# Patient Record
Sex: Female | Born: 1985 | Race: White | Hispanic: Yes | Marital: Single | Smoking: Never smoker
Health system: Southern US, Community
[De-identification: ages and names within clinical notes are randomized; demographics above are authoritative.]

## PROBLEM LIST (undated history)

## (undated) HISTORY — PX: CHOLECYSTECTOMY: SHX55

---

## 2014-09-27 ENCOUNTER — Emergency Department: Payer: Self-pay | Admitting: Internal Medicine

## 2014-09-27 LAB — URINALYSIS, COMPLETE
Bilirubin,UR: NEGATIVE
GLUCOSE, UR: NEGATIVE mg/dL (ref 0–75)
KETONE: NEGATIVE
NITRITE: NEGATIVE
Ph: 7 (ref 4.5–8.0)
RBC,UR: 13 /HPF (ref 0–5)
Specific Gravity: 1.023 (ref 1.003–1.030)
Squamous Epithelial: 21
WBC UR: 67 /HPF (ref 0–5)

## 2014-09-27 LAB — CBC
HCT: 39 % (ref 35.0–47.0)
HGB: 12.7 g/dL (ref 12.0–16.0)
MCH: 27.2 pg (ref 26.0–34.0)
MCHC: 32.7 g/dL (ref 32.0–36.0)
MCV: 83 fL (ref 80–100)
PLATELETS: 290 10*3/uL (ref 150–440)
RBC: 4.68 10*6/uL (ref 3.80–5.20)
RDW: 14.9 % — ABNORMAL HIGH (ref 11.5–14.5)
WBC: 4.6 10*3/uL (ref 3.6–11.0)

## 2014-09-27 LAB — D-DIMER(ARMC): D-DIMER: 235 ng/mL

## 2014-09-27 LAB — BASIC METABOLIC PANEL
Anion Gap: 4 — ABNORMAL LOW (ref 7–16)
BUN: 11 mg/dL (ref 7–18)
CREATININE: 0.72 mg/dL (ref 0.60–1.30)
Calcium, Total: 8.9 mg/dL (ref 8.5–10.1)
Chloride: 107 mmol/L (ref 98–107)
Co2: 27 mmol/L (ref 21–32)
EGFR (Non-African Amer.): >60
Glucose: 123 mg/dL — ABNORMAL HIGH (ref 65–99)
OSMOLALITY: 276 (ref 275–301)
Potassium: 3.7 mmol/L (ref 3.5–5.1)
SODIUM: 138 mmol/L (ref 136–145)

## 2014-09-27 LAB — PRO B NATRIURETIC PEPTIDE: B-TYPE NATIURETIC PEPTID: 12 pg/mL (ref 0–125)

## 2014-09-27 LAB — TROPONIN I: Troponin-I: <0.02 ng/mL

## 2014-10-09 ENCOUNTER — Emergency Department: Payer: Self-pay | Admitting: Emergency Medicine

## 2014-10-09 LAB — BASIC METABOLIC PANEL
Anion Gap: 7 (ref 7–16)
BUN: 15 mg/dL (ref 7–18)
CHLORIDE: 106 mmol/L (ref 98–107)
CREATININE: 0.58 mg/dL — AB (ref 0.60–1.30)
Calcium, Total: 8.8 mg/dL (ref 8.5–10.1)
Co2: 27 mmol/L (ref 21–32)
EGFR (African American): >60
EGFR (Non-African Amer.): >60
Glucose: 107 mg/dL — ABNORMAL HIGH (ref 65–99)
Osmolality: 281 (ref 275–301)
Potassium: 3.6 mmol/L (ref 3.5–5.1)
SODIUM: 140 mmol/L (ref 136–145)

## 2014-10-09 LAB — CBC
HCT: 39.5 % (ref 35.0–47.0)
HGB: 12.7 g/dL (ref 12.0–16.0)
MCH: 27 pg (ref 26.0–34.0)
MCHC: 32.1 g/dL (ref 32.0–36.0)
MCV: 84 fL (ref 80–100)
PLATELETS: 287 10*3/uL (ref 150–440)
RBC: 4.69 10*6/uL (ref 3.80–5.20)
RDW: 14.6 % — ABNORMAL HIGH (ref 11.5–14.5)
WBC: 5.4 10*3/uL (ref 3.6–11.0)

## 2014-10-09 LAB — TROPONIN I

## 2014-10-09 LAB — D-DIMER(ARMC): D-DIMER: 177 ng/mL

## 2014-10-19 ENCOUNTER — Emergency Department: Payer: Self-pay | Admitting: Internal Medicine

## 2014-10-19 LAB — CBC
HCT: 43.6 % (ref 35.0–47.0)
HGB: 13.9 g/dL (ref 12.0–16.0)
MCH: 26.7 pg (ref 26.0–34.0)
MCHC: 32 g/dL (ref 32.0–36.0)
MCV: 84 fL (ref 80–100)
Platelet: 356 10*3/uL (ref 150–440)
RBC: 5.22 10*6/uL — ABNORMAL HIGH (ref 3.80–5.20)
RDW: 14.5 % (ref 11.5–14.5)
WBC: 13.4 10*3/uL — ABNORMAL HIGH (ref 3.6–11.0)

## 2014-10-19 LAB — DRUG SCREEN, URINE
Amphetamines, Ur Screen: NEGATIVE (ref ?–1000)
BARBITURATES, UR SCREEN: NEGATIVE (ref ?–200)
BENZODIAZEPINE, UR SCRN: NEGATIVE (ref ?–200)
CANNABINOID 50 NG, UR ~~LOC~~: NEGATIVE (ref ?–50)
Cocaine Metabolite,Ur ~~LOC~~: NEGATIVE (ref ?–300)
MDMA (ECSTASY) UR SCREEN: NEGATIVE (ref ?–500)
Methadone, Ur Screen: NEGATIVE (ref ?–300)
Opiate, Ur Screen: NEGATIVE (ref ?–300)
PHENCYCLIDINE (PCP) UR S: NEGATIVE (ref ?–25)
Tricyclic, Ur Screen: NEGATIVE (ref ?–1000)

## 2014-10-19 LAB — COMPREHENSIVE METABOLIC PANEL
ALT: 21 U/L
Albumin: 4.4 g/dL (ref 3.4–5.0)
Alkaline Phosphatase: 90 U/L
Anion Gap: 5 — ABNORMAL LOW (ref 7–16)
BILIRUBIN TOTAL: 0.5 mg/dL (ref 0.2–1.0)
BUN: 15 mg/dL (ref 7–18)
CO2: 30 mmol/L (ref 21–32)
Calcium, Total: 9.5 mg/dL (ref 8.5–10.1)
Chloride: 105 mmol/L (ref 98–107)
Creatinine: 0.77 mg/dL (ref 0.60–1.30)
EGFR (Non-African Amer.): >60
Glucose: 121 mg/dL — ABNORMAL HIGH (ref 65–99)
Osmolality: 281 (ref 275–301)
Potassium: 3.5 mmol/L (ref 3.5–5.1)
SGOT(AST): 26 U/L (ref 15–37)
SODIUM: 140 mmol/L (ref 136–145)
Total Protein: 9.1 g/dL — ABNORMAL HIGH (ref 6.4–8.2)

## 2014-10-19 LAB — URINALYSIS, COMPLETE
BACTERIA: NONE SEEN
BLOOD: NEGATIVE
Bilirubin,UR: NEGATIVE
GLUCOSE, UR: NEGATIVE mg/dL (ref 0–75)
NITRITE: NEGATIVE
Ph: 6 (ref 4.5–8.0)
Protein: 100
RBC,UR: 4 /HPF (ref 0–5)
SPECIFIC GRAVITY: 1.026 (ref 1.003–1.030)

## 2014-10-19 LAB — SALICYLATE LEVEL: Salicylates, Serum: <1.7 mg/dL

## 2014-10-19 LAB — ETHANOL: ETHANOL LVL: 3 mg/dL

## 2014-10-19 LAB — ACETAMINOPHEN LEVEL: Acetaminophen: <2 ug/mL

## 2014-11-19 ENCOUNTER — Emergency Department: Payer: Self-pay | Admitting: Emergency Medicine

## 2014-12-09 ENCOUNTER — Emergency Department: Payer: Self-pay | Admitting: Emergency Medicine
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
## 2015-01-28 NOTE — Consult Note (Signed)
PATIENT NAME:  Karen Bridges, Karen Bridges MR#:  [id number] DATE OF BIRTH:  1986/03/23  DATE OF CONSULTATION:  10/20/2014  REFERRING PHYSICIAN:   CONSULTING PHYSICIAN:  Montine Hight K. Brynnan Rodenbaugh, MD  SUBJECTIVE: The patient was seen in consultation in [facility] emergency room in [facility]. The patient is a 29 year old [place] female who is employed as a Conservation officer, naturecashier at [employer] and is not married and has been living with ex-fiancee's family. The patient and staff report that she found her fiancee is cheating and she got upset and anxious and took an overdose of few pills of Prozac which she got while she was in the [employer]. The patient had Prozac 20 mg capsules which she took 5 of them because she was upset and angry. Brother-in-law called for help and EMS brought her here.   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 No history of suicide attempt.  ALCOHOL AND DRUGS: Denies drinking alcohol or having problems with alcohol.   MENTAL STATUS: The patient is alert and oriented, calm, pleasant and cooperative. No agitation. Affect is appropriate with her mood. Denies feeling depressed, but she is upset and angry about her fiancee cheating. Denies auditory or visual hallucinations. Denies hearing voices or seeing things. Denies paranoid or suspicious ideas. Denies thought insertion or thought control. Denies having grandiose ideas. Memory is intact. Cognition intact. General knowledge and information fair. Insight and judgment fair. Impulse control is fair and improved.  IMPRESSION: Adjustment disorder with anxiety and depression.  RECOMMENDATIONS: Discontinue involuntary commitment and discharge the patient home. The patient will take help from the [facility] as she is discharged from the [employer]. She has been [facility] for support and coping skills to deal with the stresses of life.  ____________________________ Jannet MantisSurya K.  Guss Bundehalla, MD skc:sb D: 10/20/2014 16:56:26 ET T: 10/20/2014 17:22:13 ET JOB#: [id number]  cc: Monika SalkSurya K. Guss Bundehalla, MD, <Dictator> Beau FannySURYA K Quisha Mabie MD ELECTRONICALLY SIGNED 10/21/2014 16:30

## 2015-10-05 ENCOUNTER — Emergency Department
Admission: EM | Admit: 2015-10-05 | Discharge: 2015-10-05 | Disposition: A | Discharge status: Home / Self Care | Payer: Non-veteran care | Attending: Emergency Medicine | Admitting: Emergency Medicine

## 2015-10-05 ENCOUNTER — Emergency Department: Payer: Non-veteran care

## 2015-10-05 ENCOUNTER — Encounter: Payer: Self-pay | Admitting: Emergency Medicine

## 2015-10-05 DIAGNOSIS — R200 Anesthesia of skin: Secondary | ICD-10-CM

## 2015-10-05 DIAGNOSIS — R109 Unspecified abdominal pain: Secondary | ICD-10-CM | POA: Insufficient documentation

## 2015-10-05 DIAGNOSIS — R519 Headache, unspecified: Secondary | ICD-10-CM

## 2015-10-05 DIAGNOSIS — R3915 Urgency of urination: Secondary | ICD-10-CM | POA: Insufficient documentation

## 2015-10-05 DIAGNOSIS — Z3202 Encounter for pregnancy test, result negative: Secondary | ICD-10-CM | POA: Insufficient documentation

## 2015-10-05 DIAGNOSIS — R350 Frequency of micturition: Secondary | ICD-10-CM | POA: Insufficient documentation

## 2015-10-05 DIAGNOSIS — R51 Headache: Secondary | ICD-10-CM | POA: Insufficient documentation

## 2015-10-05 DIAGNOSIS — R202 Paresthesia of skin: Secondary | ICD-10-CM | POA: Insufficient documentation

## 2015-10-05 LAB — COMPREHENSIVE METABOLIC PANEL
ALBUMIN: 4.5 g/dL (ref 3.5–5.0)
ALT: 13 U/L — AB (ref 14–54)
AST: 14 U/L — AB (ref 15–41)
Alkaline Phosphatase: 58 U/L (ref 38–126)
Anion gap: 5 (ref 5–15)
BILIRUBIN TOTAL: 0.4 mg/dL (ref 0.3–1.2)
BUN: 14 mg/dL (ref 6–20)
CHLORIDE: 104 mmol/L (ref 101–111)
CO2: 30 mmol/L (ref 22–32)
Calcium: 9.3 mg/dL (ref 8.9–10.3)
Creatinine, Ser: 0.44 mg/dL (ref 0.44–1.00)
GFR calc Af Amer: >60 mL/min (ref >60)
GFR calc non Af Amer: >60 mL/min (ref >60)
GLUCOSE: 107 mg/dL — AB (ref 65–99)
POTASSIUM: 3.6 mmol/L (ref 3.5–5.1)
Sodium: 139 mmol/L (ref 135–145)
Total Protein: 8 g/dL (ref 6.5–8.1)

## 2015-10-05 LAB — URINALYSIS COMPLETE WITH MICROSCOPIC (ARMC ONLY)
BILIRUBIN URINE: NEGATIVE
Bacteria, UA: NONE SEEN
GLUCOSE, UA: NEGATIVE mg/dL
Hgb urine dipstick: NEGATIVE
Ketones, ur: NEGATIVE mg/dL
Nitrite: NEGATIVE
PH: 7 (ref 5.0–8.0)
Protein, ur: NEGATIVE mg/dL
Specific Gravity, Urine: 1.017 (ref 1.005–1.030)

## 2015-10-05 LAB — CBC WITH DIFFERENTIAL/PLATELET
Basophils Absolute: 0 10*3/uL (ref 0–0.1)
Basophils Relative: 1 %
EOS PCT: 1 %
Eosinophils Absolute: 0.1 10*3/uL (ref 0–0.7)
HCT: 37.7 % (ref 35.0–47.0)
Hemoglobin: 12.2 g/dL (ref 12.0–16.0)
LYMPHS ABS: 1.7 10*3/uL (ref 1.0–3.6)
LYMPHS PCT: 20 %
MCH: 24.6 pg — AB (ref 26.0–34.0)
MCHC: 32.3 g/dL (ref 32.0–36.0)
MCV: 76.1 fL — AB (ref 80.0–100.0)
MONO ABS: 0.5 10*3/uL (ref 0.2–0.9)
Monocytes Relative: 5 %
Neutro Abs: 6.2 10*3/uL (ref 1.4–6.5)
Neutrophils Relative %: 73 %
PLATELETS: 343 10*3/uL (ref 150–440)
RBC: 4.95 MIL/uL (ref 3.80–5.20)
RDW: 15.8 % — AB (ref 11.5–14.5)
WBC: 8.4 10*3/uL (ref 3.6–11.0)

## 2015-10-05 LAB — POCT PREGNANCY, URINE: Preg Test, Ur: NEGATIVE

## 2015-10-05 MED ORDER — IBUPROFEN 400 MG PO TABS
600.0000 mg | ORAL_TABLET | Freq: Once | ORAL | Status: AC
  Administered 2015-10-05: 600 mg via ORAL
  Filled 2015-10-05: qty 2

## 2015-10-05 NOTE — Discharge Instructions (Signed)
You were evaluated for facial numbness and intermittent headaches, as well as right-sided back pain. Your exam and evaluation are reassuring today in the emergency department.  Your CT scan of the brain was normal.  In terms of her intermittent headaches and right-sided facial numbness, I am recommending he follow-up with a primary care physician as well as a neurologist.   We discussed your blood work was normal and together decided to hold off on CT scan of abdomen at this point time.  Return to the emergency department for any worsening condition including any new or worsening headache, any confusion or altered mental status, new weakness or numbness, slurred speech, fever, neck pain or stiffness, or for any new or worsening abdominal pain, vomiting, or black or bloody stools.    Abdominal Pain, Adult Many things can cause belly (abdominal) pain. Most times, the belly pain is not dangerous. Many cases of belly pain can be watched and treated at home. HOME CARE   Do not take medicines that help you go poop (laxatives) unless told to by your doctor.  Only take medicine as told by your doctor.  Eat or drink as told by your doctor. Your doctor will tell you if you should be on a special diet. GET HELP IF:  You do not know what is causing your belly pain.  You have belly pain while you are sick to your stomach (nauseous) or have runny poop (diarrhea).  You have pain while you pee or poop.  Your belly pain wakes you up at night.  You have belly pain that gets worse or better when you eat.  You have belly pain that gets worse when you eat fatty foods.  You have a fever. GET HELP RIGHT AWAY IF:   The pain does not go away within 2 hours.  You keep throwing up (vomiting).  The pain changes and is only in the right or left part of the belly.  You have bloody or tarry looking poop. MAKE SURE YOU:   Understand these instructions.  Will watch your condition.  Will get help  right away if you are not doing well or get worse.   This information is not intended to replace advice given to you by your health care provider. Make sure you discuss any questions you have with your health care provider.   Document Released: 03/03/2008 Document Revised: 10/06/2014 Document Reviewed: 05/25/2013 Elsevier Interactive Patient Education 2016 Elsevier Inc.  General Headache Without Cause A headache is pain or discomfort felt around the head or neck area. The specific cause of a headache may not be found. There are many causes and types of headaches. A few common ones are:  Tension headaches.  Migraine headaches.  Cluster headaches.  Chronic daily headaches. HOME CARE INSTRUCTIONS  Watch your condition for any changes. Take these steps to help with your condition: Managing Pain  Take over-the-counter and prescription medicines only as told by your health care provider.  Lie down in a dark, quiet room when you have a headache.  If directed, apply ice to the head and neck area:  Put ice in a plastic bag.  Place a towel between your skin and the bag.  Leave the ice on for 20 minutes, 2-3 times per day.  Use a heating pad or hot shower to apply heat to the head and neck area as told by your health care provider.  Keep lights dim if bright lights bother you or make your headaches worse.  Eating and Drinking  Eat meals on a regular schedule.  Limit alcohol use.  Decrease the amount of caffeine you drink, or stop drinking caffeine. General Instructions  Keep all follow-up visits as told by your health care provider. This is important.  Keep a headache journal to help find out what may trigger your headaches. For example, write down:  What you eat and drink.  How much sleep you get.  Any change to your diet or medicines.  Try massage or other relaxation techniques.  Limit stress.  Sit up straight, and do not tense your muscles.  Do not use tobacco  products, including cigarettes, chewing tobacco, or e-cigarettes. If you need help quitting, ask your health care provider.  Exercise regularly as told by your health care provider.  Sleep on a regular schedule. Get 7-9 hours of sleep, or the amount recommended by your health care provider. SEEK MEDICAL CARE IF:   Your symptoms are not helped by medicine.  You have a headache that is different from the usual headache.  You have nausea or you vomit.  You have a fever. SEEK IMMEDIATE MEDICAL CARE IF:   Your headache becomes severe.  You have repeated vomiting.  You have a stiff neck.  You have a loss of vision.  You have problems with speech.  You have pain in the eye or ear.  You have muscular weakness or loss of muscle control.  You lose your balance or have trouble walking.  You feel faint or pass out.  You have confusion.   This information is not intended to replace advice given to you by your health care provider. Make sure you discuss any questions you have with your health care provider.   Document Released: 09/15/2005 Document Revised: 06/06/2015 Document Reviewed: 01/08/2015 Elsevier Interactive Patient Education 2016 Elsevier Inc.  Paresthesia Paresthesia is an abnormal burning or prickling sensation. This sensation is generally felt in the hands, arms, legs, or feet. However, it may occur in any part of the body. Usually, it is not painful. The feeling may be described as:  Tingling or numbness.  Pins and needles.  Skin crawling.  Buzzing.  Limbs falling asleep.  Itching. Most people experience temporary (transient) paresthesia at some time in their lives. Paresthesia may occur when you breathe too quickly (hyperventilation). It can also occur without any apparent cause. Commonly, paresthesia occurs when pressure is placed on a nerve. The sensation quickly goes away after the pressure is removed. For some people, however, paresthesia is a long-lasting  (chronic) condition that is caused by an underlying disorder. If you continue to have paresthesia, you may need further medical evaluation. HOME CARE INSTRUCTIONS Watch your condition for any changes. Taking the following actions may help to lessen any discomfort that you are feeling:  Avoid drinking alcohol.  Try acupuncture or massage to help relieve your symptoms.  Keep all follow-up visits as directed by your health care provider. This is important. SEEK MEDICAL CARE IF:  You continue to have episodes of paresthesia.  Your burning or prickling feeling gets worse when you walk.  You have pain, cramps, or dizziness.  You develop a rash. SEEK IMMEDIATE MEDICAL CARE IF:  You feel weak.  You have trouble walking or moving.  You have problems with speech, understanding, or vision.  You feel confused.  You cannot control your bladder or bowel movements.  You have numbness after an injury.  You faint.   This information is not intended to replace advice  given to you by your health care provider. Make sure you discuss any questions you have with your health care provider.   Document Released: 09/05/2002 Document Revised: 01/30/2015 Document Reviewed: 09/11/2014 Elsevier Interactive Patient Education [employer]
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
## 2015-10-05 NOTE — ED Notes (Signed)
Pt reports right side flank pain and urinary frequency.  Also reports right side face pain and numbness x 1 month.  No facial droop noted, grips equal.

## 2020-04-22 ENCOUNTER — Encounter: Payer: Self-pay | Admitting: Emergency Medicine

## 2020-04-22 ENCOUNTER — Emergency Department
Admission: EM | Admit: 2020-04-22 | Discharge: 2020-04-22 | Disposition: A | Discharge status: Home / Self Care | Payer: No Typology Code available for payment source | Attending: Emergency Medicine | Admitting: Emergency Medicine

## 2020-04-22 ENCOUNTER — Other Ambulatory Visit: Payer: Self-pay

## 2020-04-22 ENCOUNTER — Emergency Department: Payer: No Typology Code available for payment source

## 2020-04-22 DIAGNOSIS — M25511 Pain in right shoulder: Secondary | ICD-10-CM | POA: Diagnosis present

## 2020-04-22 MED ORDER — KETOROLAC TROMETHAMINE 30 MG/ML IJ SOLN
30.0000 mg | Freq: Once | INTRAMUSCULAR | Status: AC
  Administered 2020-04-22: 30 mg via INTRAMUSCULAR
  Filled 2020-04-22: qty 1
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
## 2020-10-13 DIAGNOSIS — Z20822 Contact with and (suspected) exposure to covid-19: Secondary | ICD-10-CM | POA: Diagnosis not present

## 2021-12-09 ENCOUNTER — Emergency Department: Payer: No Typology Code available for payment source

## 2021-12-09 ENCOUNTER — Emergency Department
Admission: EM | Admit: 2021-12-09 | Discharge: 2021-12-09 | Disposition: A | Discharge status: Home / Self Care | Payer: No Typology Code available for payment source | Attending: Emergency Medicine | Admitting: Emergency Medicine

## 2021-12-09 ENCOUNTER — Telehealth: Payer: Self-pay | Admitting: Emergency Medicine

## 2021-12-09 ENCOUNTER — Other Ambulatory Visit: Payer: Self-pay

## 2021-12-09 DIAGNOSIS — K8020 Calculus of gallbladder without cholecystitis without obstruction: Secondary | ICD-10-CM | POA: Diagnosis not present

## 2021-12-09 DIAGNOSIS — R1011 Right upper quadrant pain: Secondary | ICD-10-CM

## 2021-12-09 LAB — COMPREHENSIVE METABOLIC PANEL
ALT: 9 U/L (ref 0–44)
AST: 13 U/L — ABNORMAL LOW (ref 15–41)
Albumin: 3.9 g/dL (ref 3.5–5.0)
Alkaline Phosphatase: 54 U/L (ref 38–126)
Anion gap: 7 (ref 5–15)
BUN: 16 mg/dL (ref 6–20)
CO2: 23 mmol/L (ref 22–32)
Calcium: 8.8 mg/dL — ABNORMAL LOW (ref 8.9–10.3)
Chloride: 104 mmol/L (ref 98–111)
Creatinine, Ser: 0.71 mg/dL (ref 0.44–1.00)
GFR, Estimated: >60 mL/min (ref >60)
Glucose, Bld: 131 mg/dL — ABNORMAL HIGH (ref 70–99)
Potassium: 3.8 mmol/L (ref 3.5–5.1)
Sodium: 134 mmol/L — ABNORMAL LOW (ref 135–145)
Total Bilirubin: 0.7 mg/dL (ref 0.3–1.2)
Total Protein: 7.3 g/dL (ref 6.5–8.1)

## 2021-12-09 LAB — CBC
HCT: 39.2 % (ref 36.0–46.0)
Hemoglobin: 12.6 g/dL (ref 12.0–15.0)
MCH: 28.3 pg (ref 26.0–34.0)
MCHC: 32.1 g/dL (ref 30.0–36.0)
MCV: 87.9 fL (ref 80.0–100.0)
Platelets: 284 10*3/uL (ref 150–400)
RBC: 4.46 MIL/uL (ref 3.87–5.11)
RDW: 14.1 % (ref 11.5–15.5)
WBC: 7 10*3/uL (ref 4.0–10.5)
nRBC: 0 % (ref 0.0–0.2)

## 2021-12-09 LAB — URINALYSIS, ROUTINE W REFLEX MICROSCOPIC
Bilirubin Urine: NEGATIVE
Glucose, UA: NEGATIVE mg/dL
Hgb urine dipstick: NEGATIVE
Ketones, ur: NEGATIVE mg/dL
Leukocytes,Ua: NEGATIVE
Nitrite: NEGATIVE
Protein, ur: NEGATIVE mg/dL
Specific Gravity, Urine: 1.018 (ref 1.005–1.030)
pH: 7 (ref 5.0–8.0)

## 2021-12-09 LAB — LIPASE, BLOOD: Lipase: 31 U/L (ref 11–51)

## 2021-12-09 LAB — POC URINE PREG, ED: Preg Test, Ur: NEGATIVE

## 2021-12-09 MED ORDER — ONDANSETRON 4 MG PO TBDP: 4.0000 mg | ORAL_TABLET | Freq: Four times a day (QID) | ORAL | 0 refills | Status: AC | PRN

## 2021-12-09 MED ORDER — KETOROLAC TROMETHAMINE 30 MG/ML IJ SOLN
30.0000 mg | Freq: Once | INTRAMUSCULAR | Status: AC
  Administered 2021-12-09: 30 mg via INTRAVENOUS
  Filled 2021-12-09: qty 1

## 2021-12-09 MED ORDER — ONDANSETRON HCL 4 MG/2ML IJ SOLN
4.0000 mg | Freq: Once | INTRAMUSCULAR | Status: AC
  Administered 2021-12-09: 4 mg via INTRAVENOUS
  Filled 2021-12-09: qty 2

## 2021-12-09 MED ORDER — HYDROCODONE-ACETAMINOPHEN 5-325 MG PO TABS: 2.0000 | ORAL_TABLET | Freq: Four times a day (QID) | ORAL | 0 refills | Status: AC | PRN

## 2021-12-09 MED ORDER — OXYCODONE-ACETAMINOPHEN 5-325 MG PO TABS: 1.0000 | ORAL_TABLET | Freq: Three times a day (TID) | ORAL | 0 refills | Status: AC | PRN

## 2021-12-09 NOTE — ED Triage Notes (Signed)
Pt with ruq pain radiating to back with nausea for an hour. Pt appears in no acute distress, denies fever or diarrhea.  ?

## 2021-12-09 NOTE — Discharge Instructions (Addendum)
You are being provided a prescription for opiates (also known as narcotics) for pain control.  Opiates can be addictive and should only be used when absolutely necessary for pain control when other alternatives do not work.  We recommend you only use them for the recommended amount of time and only as prescribed.  Please do not take with other sedative medications or alcohol.  Please do not drive, operate machinery, make important decisions while taking opiates.  Please note that these medications can be addictive and have high abuse potential.  Patients can become addicted to narcotics after only taking them for a few days.  Please keep these medications locked away from children, teenagers or any family members with history of substance abuse.  Narcotic pain medicine may also make you constipated.  You may use over-the-counter medications such as MiraLAX, Colace to prevent constipation.  If you become constipated, you may use over-the-counter enemas as needed.  Itching and nausea are also common side effects of narcotic pain medication.  If you develop uncontrolled vomiting or a rash, please stop these medications and seek medical care. ? ? ?You were found to have a large gallbladder stone at the gallbladder neck but your blood work today was reassuring and your pain was well controlled.  We recommend very close follow-up with surgery as an outpatient.  Began having worsening uncontrolled pain at home, fever of 100.4 or higher, vomiting that does not stop, you notice yellowing of your skin or eyes or very light-colored stools, please return to the emergency department. ?

## 2021-12-09 NOTE — Telephone Encounter (Signed)
[facility] does not have medication

## 2021-12-09 NOTE — ED Provider Notes (Signed)
[facility] Provider Note    Event Date/Time   First MD Initiated Contact with Patient 12/09/21 [phone]     (approximate)   History   Abdominal Pain   HPI  Karen Bridges is a 36 y.o. female with history of anemia who presents to the emergency department complaints of sharp right upper quadrant pain that radiates into the right flank that started about an hour and a half ago.  States she ate ribs and cornbread for dinner.  She has had nausea without vomiting.  No diarrhea.  No fevers, chest pain, shortness of breath, cough, dysuria, hematuria, vaginal bleeding or discharge.  No previous abdominal surgeries.  States she has had pain like this before that she has not seen a doctor for but states it normally goes away on its own.  She has never been told she has gallstones.   History provided by patient.    No past medical history on file.  No past surgical history on file.  MEDICATIONS:  Prior to Admission medications   Not on File    Physical Exam   Triage Vital Signs: ED Triage Vitals  Enc Vitals Group     BP 12/09/21 0046 132/62     Pulse Rate 12/09/21 0046 84     Resp 12/09/21 0046 16     Temp 12/09/21 0046 98.3 F (36.8 C)     Temp Source 12/09/21 0046 Oral     SpO2 12/09/21 0046 97 %     Weight 12/09/21 0045 160 lb (72.6 kg)     Height 12/09/21 0045 5\' 1"  (1.549 m)     Head Circumference --      Peak Flow --      Pain Score 12/09/21 0045 7     Pain Loc --      Pain Edu? --      Excl. in GC? --     Most recent vital signs: Vitals:   12/09/21 0119 12/09/21 0241  BP: (!) 124/59 109/62  Pulse: 76 70  Resp: 16 14  Temp:    SpO2: 98% 96%    CONSTITUTIONAL: Alert and oriented and responds appropriately to questions. Well-appearing; well-nourished, nontoxic HEAD: Normocephalic, atraumatic EYES: Conjunctivae clear, pupils appear equal, sclera nonicteric ENT: normal nose; moist mucous membranes NECK: Supple, normal ROM CARD:  RRR; S1 and S2 appreciated; no murmurs, no clicks, no rubs, no gallops RESP: Normal chest excursion without splinting or tachypnea; breath sounds clear and equal bilaterally; no wheezes, no rhonchi, no rales, no hypoxia or respiratory distress, speaking full sentences ABD/GI: Normal bowel sounds; non-distended; soft, tender to palpation of the right upper quadrant with a positive Murphy sign, no tenderness at McBurney's point BACK: The back appears normal EXT: Normal ROM in all joints; no deformity noted, no edema; no cyanosis SKIN: Normal color for age and race; warm; no rash on exposed skin NEURO: Moves all extremities equally, normal speech PSYCH: The patient's mood and manner are appropriate.   ED Results / Procedures / Treatments   LABS: (all labs ordered are listed, but only abnormal results are displayed) Labs Reviewed  COMPREHENSIVE METABOLIC PANEL - Abnormal; Notable for the following components:      Result Value   Sodium 134 (*)    Glucose, Bld 131 (*)    Calcium 8.8 (*)    AST 13 (*)    All other components within normal limits  URINALYSIS, ROUTINE W REFLEX MICROSCOPIC - Abnormal; Notable for the following  components:   Color, Urine YELLOW (*)    APPearance CLEAR (*)    All other components within normal limits  LIPASE, BLOOD  CBC  POC URINE PREG, ED     EKG:  EKG Interpretation  Date/Time:  Monday December 09 2021 00:54:41 EDT Ventricular Rate:  82 PR Interval:  112 QRS Duration: 86 QT Interval:  362 QTC Calculation: 422 R Axis:   93 Text Interpretation: Normal sinus rhythm Rightward axis Borderline ECG When compared with ECG of 09-Oct-2014 09:16, No significant change was found Confirmed by Rochele Raring [phone]) on 12/09/2021 1:04:07 AM         RADIOLOGY: My personal review and interpretation of imaging: Ultrasound shows large gallstone at the gallbladder neck.  I have personally reviewed all radiology reports.   US Abdomen Limited RUQ  (LIVER/GB)  Result Date: 12/09/2021 CLINICAL DATA:  Right upper quadrant pain EXAM: ULTRASOUND ABDOMEN LIMITED RIGHT UPPER QUADRANT COMPARISON:  None. FINDINGS: Gallbladder: Stone in the neck of the gallbladder measures 1.9 cm. No wall thickening or sonographic Murphy's sign. Common bile duct: Diameter: Normal caliber, 2 mm Liver: No focal lesion identified. Within normal limits in parenchymal echogenicity. Portal vein is patent on color Doppler imaging with normal direction of blood flow towards the liver. Other: None. IMPRESSION: 1.9 cm gallstone lodged in the neck of the gallbladder. No sonographic evidence of acute cholecystitis. Electronically Signed   By: Charlett Nose M.D.   On: 12/09/2021 01:41     PROCEDURES:  Critical Care performed: No   CRITICAL CARE Performed by: Baxter Hire Aryaan Persichetti   Total critical care time: 0 minutes  Critical care time was exclusive of separately billable procedures and treating other patients.  Critical care was necessary to treat or prevent imminent or life-threatening deterioration.  Critical care was time spent personally by me on the following activities: development of treatment plan with patient and/or surrogate as well as nursing, discussions with consultants, evaluation of patient's response to treatment, examination of patient, obtaining history from patient or surrogate, ordering and performing treatments and interventions, ordering and review of laboratory studies, ordering and review of radiographic studies, pulse oximetry and re-evaluation of patient's condition.   Procedures    IMPRESSION / MDM / ASSESSMENT AND PLAN / ED COURSE  I reviewed the triage vital signs and the nursing notes.    Patient here with right upper quadrant abdominal pain and nausea.  Has intermittently had similar pain that has never been worked up.     DIFFERENTIAL DIAGNOSIS (includes but not limited to):   Biliary colic, choledocholithiasis, cholecystitis,  cholangitis, pancreatitis, gastritis, GERD, H. pylori, peptic ulcer disease, kidney stone, UTI, pyelonephritis, less likely appendicitis, colitis, bowel obstruction, ACS, PE   PLAN: We will obtain CBC, CMP, lipase, urinalysis, urine pregnancy test, right upper quadrant ultrasound.  EKG obtained from triage is nonischemic.  Will give Toradol, Zofran for symptomatic relief.   MEDICATIONS GIVEN IN ED: Medications  ketorolac (TORADOL) 30 MG/ML injection 30 mg (30 mg Intravenous Given 12/09/21 0113)  ondansetron (ZOFRAN) injection 4 mg (4 mg Intravenous Given 12/09/21 0113)     ED COURSE: Patient's labs show no leukocytosis.  Normal LFTs, lipase.  Pregnancy test negative.  Right upper quadrant ultrasound reviewed by myself and radiologist and shows a large gallstone lodged at the neck of the gallbladder but no cholecystitis, choledocholithiasis.  She reports her pain has resolved after Toradol and she is feeling much better.  We did discuss that her recurrent symptoms are likely due to  biliary colic and given the size of the stone and that it is at the neck of the gallbladder that this could ultimately lead to cholecystitis although she has no clinical signs of this currently.  I did offer discussion with general surgery for possible admission for pain control and cholecystectomy in the morning but she declines this stating that she feels much better and would prefer to follow-up with general surgery at the [facility] as an outpatient.  I feel this is also a reasonable plan.  We discussed at length return precautions.  Will discharge home with pain and nausea medicine.  Discussed the importance of a low-fat diet.  At this time, I do not feel there is any life-threatening condition present. I reviewed all nursing notes, vitals, pertinent previous records.  All lab and urine results, EKGs, imaging ordered have been independently reviewed and interpreted by myself.  I reviewed all available radiology reports  from any imaging ordered this visit.  Based on my assessment, I feel the patient is safe to be discharged home without further emergent workup and can continue workup as an outpatient as needed. Discussed all findings, treatment plan as well as usual and customary return precautions with patient.  They verbalize understanding and are comfortable with this plan.  Outpatient follow-up has been provided as needed.  All questions have been answered.    CONSULTS: Admission offered but patient declined stating that she would prefer to follow-up as an outpatient.   OUTSIDE RECORDS REVIEWED: Reviewed patient's most recent notes from the [facility] on 11/14/2021.         FINAL CLINICAL IMPRESSION(S) / ED DIAGNOSES   Final diagnoses:  RUQ abdominal pain  Calculus of gallbladder without cholecystitis without obstruction     Rx / DC Orders   ED Discharge Orders          Ordered    HYDROcodone-acetaminophen (NORCO/VICODIN) 5-325 MG tablet  Every 6 hours PRN        12/09/21 0223    ondansetron (ZOFRAN-ODT) 4 MG disintegrating tablet  Every 6 hours PRN        12/09/21 0223             Note:  This document was prepared using Dragon voice recognition software and may include unintentional dictation errors.   Jamarcus Laduke, Layla Maw, DO 12/09/21 [phone]

## 2022-06-04 IMAGING — CR DG SHOULDER 2+V*R*
3 series · 3 of 3 positions shown · non-contrast
Comparison: None.

CLINICAL DATA: Shoulder pain

EXAM:
RIGHT SHOULDER - 2+ VIEW

[shoulder grashey]
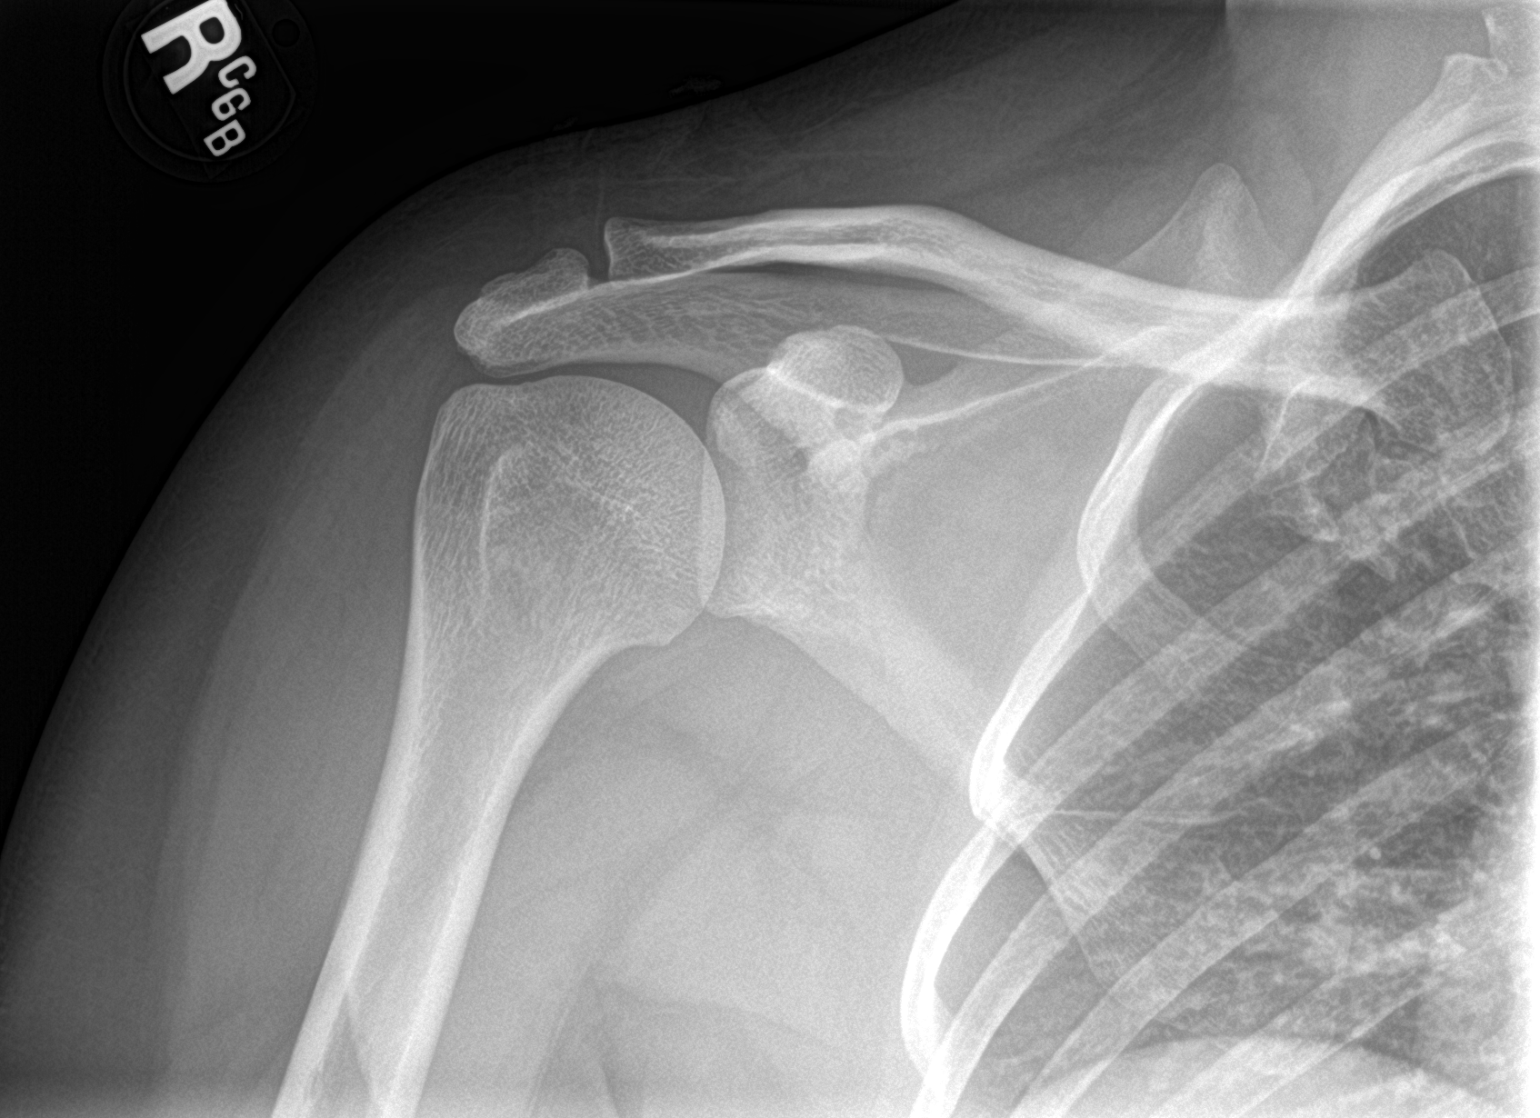

[shoulder y view]
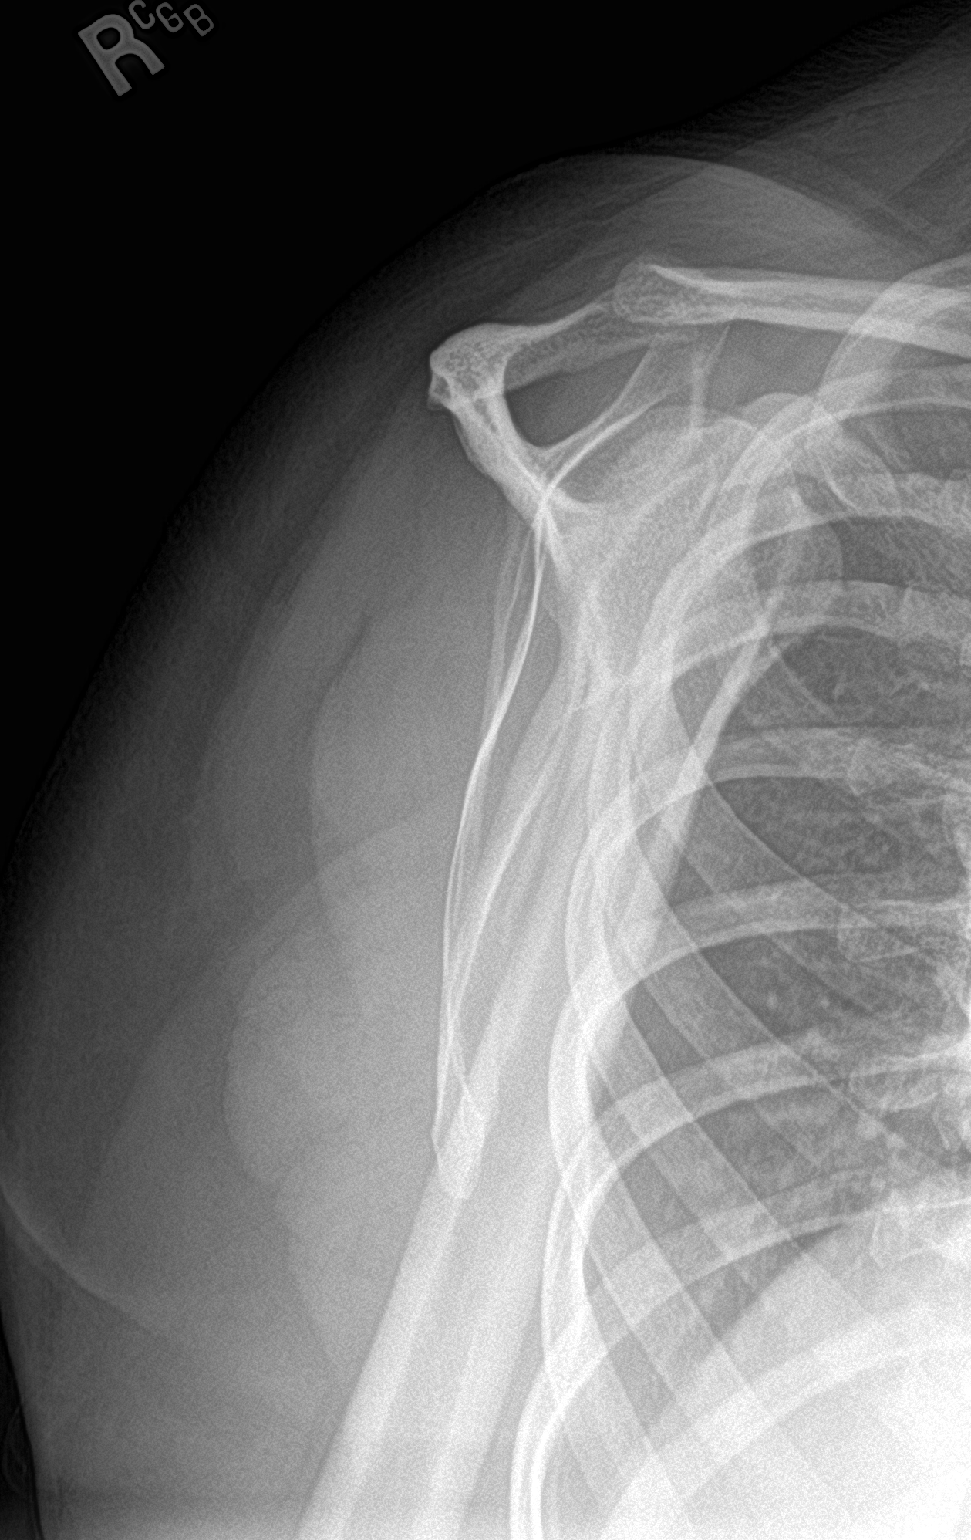

[shoulder axillary]
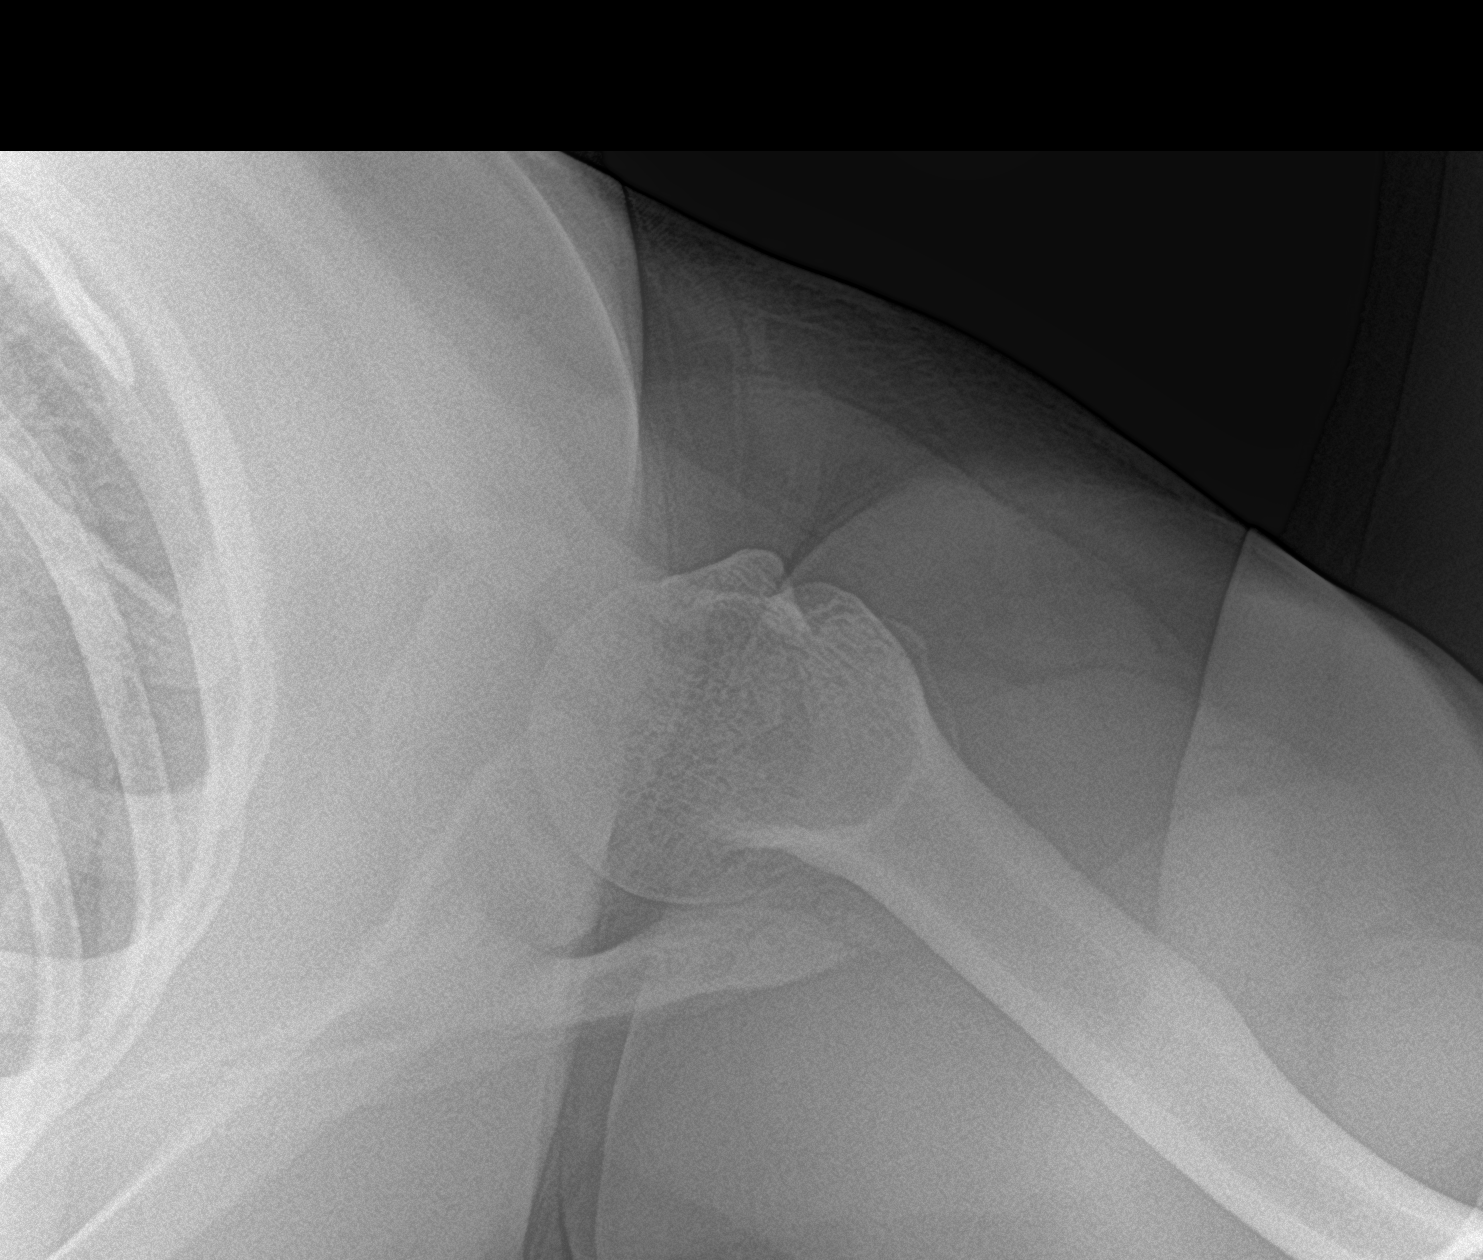

[3 of 3 positions shown; findings below may reference images not displayed]

FINDINGS: There is no evidence of fracture or dislocation. There is no
evidence of arthropathy or other focal bone abnormality. Soft
tissues are unremarkable.
IMPRESSION: No fracture or dislocation of the right shoulder. Joint spaces are
preserved.

## 2023-06-04 ENCOUNTER — Ambulatory Visit
Admission: RE | Admit: 2023-06-04 | Discharge: 2023-06-04 | Disposition: A | Discharge status: Home / Self Care | Payer: No Typology Code available for payment source | Source: Ambulatory Visit | Attending: Emergency Medicine | Admitting: Emergency Medicine

## 2023-06-04 VITALS — BP 118/78 | HR 86 | Temp 98.0°F | Resp 16

## 2023-06-04 DIAGNOSIS — H5210 Myopia, unspecified eye: Secondary | ICD-10-CM | POA: Insufficient documentation

## 2023-06-04 DIAGNOSIS — Z0289 Encounter for other administrative examinations: Secondary | ICD-10-CM | POA: Insufficient documentation

## 2023-06-04 DIAGNOSIS — D500 Iron deficiency anemia secondary to blood loss (chronic): Secondary | ICD-10-CM | POA: Insufficient documentation

## 2023-06-04 DIAGNOSIS — Z639 Problem related to primary support group, unspecified: Secondary | ICD-10-CM | POA: Insufficient documentation

## 2023-06-04 DIAGNOSIS — K2970 Gastritis, unspecified, without bleeding: Secondary | ICD-10-CM | POA: Insufficient documentation

## 2023-06-04 DIAGNOSIS — F411 Generalized anxiety disorder: Secondary | ICD-10-CM | POA: Insufficient documentation

## 2023-06-04 DIAGNOSIS — M25512 Pain in left shoulder: Secondary | ICD-10-CM | POA: Diagnosis not present

## 2023-06-04 DIAGNOSIS — E669 Obesity, unspecified: Secondary | ICD-10-CM | POA: Insufficient documentation

## 2023-06-04 DIAGNOSIS — K8020 Calculus of gallbladder without cholecystitis without obstruction: Secondary | ICD-10-CM | POA: Insufficient documentation

## 2023-06-04 DIAGNOSIS — O2690 Pregnancy related conditions, unspecified, unspecified trimester: Secondary | ICD-10-CM | POA: Insufficient documentation

## 2023-06-04 DIAGNOSIS — Z569 Unspecified problems related to employment: Secondary | ICD-10-CM | POA: Insufficient documentation

## 2023-06-04 DIAGNOSIS — F17200 Nicotine dependence, unspecified, uncomplicated: Secondary | ICD-10-CM | POA: Insufficient documentation

## 2023-06-04 DIAGNOSIS — Z124 Encounter for screening for malignant neoplasm of cervix: Secondary | ICD-10-CM | POA: Insufficient documentation

## 2023-06-04 DIAGNOSIS — R300 Dysuria: Secondary | ICD-10-CM | POA: Insufficient documentation

## 2023-06-04 DIAGNOSIS — H52229 Regular astigmatism, unspecified eye: Secondary | ICD-10-CM | POA: Insufficient documentation

## 2023-06-04 DIAGNOSIS — B351 Tinea unguium: Secondary | ICD-10-CM | POA: Insufficient documentation

## 2023-06-04 DIAGNOSIS — Z609 Problem related to social environment, unspecified: Secondary | ICD-10-CM | POA: Insufficient documentation

## 2023-06-04 DIAGNOSIS — N939 Abnormal uterine and vaginal bleeding, unspecified: Secondary | ICD-10-CM | POA: Insufficient documentation

## 2023-06-04 DIAGNOSIS — F4329 Adjustment disorder with other symptoms: Secondary | ICD-10-CM | POA: Insufficient documentation

## 2023-06-04 DIAGNOSIS — K8010 Calculus of gallbladder with chronic cholecystitis without obstruction: Secondary | ICD-10-CM | POA: Insufficient documentation

## 2023-06-04 DIAGNOSIS — F32A Depression, unspecified: Secondary | ICD-10-CM | POA: Insufficient documentation

## 2023-06-04 DIAGNOSIS — E559 Vitamin D deficiency, unspecified: Secondary | ICD-10-CM | POA: Insufficient documentation

## 2023-06-04 DIAGNOSIS — R52 Pain, unspecified: Secondary | ICD-10-CM | POA: Insufficient documentation

## 2023-06-04 DIAGNOSIS — N390 Urinary tract infection, site not specified: Secondary | ICD-10-CM | POA: Insufficient documentation

## 2023-06-04 DIAGNOSIS — F900 Attention-deficit hyperactivity disorder, predominantly inattentive type: Secondary | ICD-10-CM | POA: Insufficient documentation

## 2023-06-04 DIAGNOSIS — Z635 Disruption of family by separation and divorce: Secondary | ICD-10-CM | POA: Insufficient documentation

## 2023-06-04 MED ORDER — CYCLOBENZAPRINE HCL 10 MG PO TABS: 10.0000 mg | ORAL_TABLET | Freq: Every day | ORAL | 0 refills | Status: AC

## 2023-06-04 MED ORDER — PREDNISONE 20 MG PO TABS: 40.0000 mg | ORAL_TABLET | Freq: Every day | ORAL | 0 refills | Status: AC

## 2023-06-04 NOTE — ED Provider Notes (Signed)
Renaldo Fiddler    CSN: [id number] Arrival date & time: 06/04/23  1141      History   Chief Complaint Chief Complaint  Patient presents with   Shoulder Pain    Left shoulder painful to move - Entered by patient    HPI Karen Bridges is a 37 y.o. female.   Patient presents for evaluation of intermittent left-sided shoulder pain beginning 4 days ago.  Symptoms started abruptly upon awakening without precipitating event, injury or trauma.  Exacerbated by movement causing a sharp shooting pain to the anterior shoulder.  Symptoms improve when in a dependent position.  Pain does not radiate.  Has limited range of motion.  Has attempted use of Tylenol which has been minimally effective.  Denies presence of numbness or tingling.  Has had similar symptoms in the past to the right shoulder and at time of the evaluation it was noted to be a frozen shoulder.  History reviewed. No pertinent past medical history.  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      Home Medications    Prior to Admission medications   Medication Sig Start Date End Date Taking? Authorizing Provider  cyclobenzaprine (FLEXERIL) 10 MG tablet Take 1 tablet (10 mg total) by mouth at bedtime. 06/04/23  Yes Maridel Pixler R, NP  predniSONE (DELTASONE) 20 MG tablet Take 2 tablets (40 mg total) by mouth daily. 06/04/23  Yes Kidada Ging R, NP  HYDROcodone-acetaminophen (NORCO/VICODIN) 5-325 MG tablet Take 2 tablets by mouth every 6 (six) hours as needed. 12/09/21   Ward, Layla Maw, DO  ondansetron (ZOFRAN-ODT) 4 MG disintegrating tablet Take 1 tablet (4 mg total) by mouth every 6 (six) hours as needed for nausea or vomiting. 12/09/21   Ward, Layla Maw, DO    Family History History reviewed. No pertinent family history.  Social History Social History   Tobacco Use   Smoking status: Never   Smokeless tobacco: Never  Vaping Use   Vaping status: Never Used  Substance Use Topics   Alcohol use: Not Currently   Drug use: Never     Allergies   Patient has no known allergies.   Review of Systems Review of Systems  Constitutional: Negative.   HENT: Negative.    Respiratory: Negative.    Cardiovascular: Negative.   Musculoskeletal:  Positive for myalgias. Negative for arthralgias, back pain, gait problem, joint swelling, neck pain  and neck stiffness.  Neurological: Negative.      Physical Exam Triage Vital Signs ED Triage Vitals  Encounter Vitals Group     BP 06/04/23 1204 118/78     Systolic BP Percentile --      Diastolic BP Percentile --      Pulse Rate 06/04/23 1204 86     Resp 06/04/23 1204 16     Temp 06/04/23 1204 98 F (36.7 C)     Temp Source 06/04/23 1204 Temporal     SpO2 06/04/23 1204 97 %     Weight --      Height --      Head  Circumference --      Peak Flow --      Pain Score 06/04/23 1203 5     Pain Loc --      Pain Education --      Exclude from Growth Chart --    No data found.  Updated Vital Signs BP 118/78 (BP Location: Left Arm)   Pulse 86   Temp 98 F (36.7 C) (Temporal)   Resp 16   LMP 05/13/2023   SpO2 97%   Visual Acuity Right Eye Distance:   Left Eye Distance:   Bilateral Distance:    Right Eye Near:   Left Eye Near:    Bilateral Near:     Physical Exam Constitutional:      Appearance: Normal appearance.  Eyes:     Extraocular Movements: Extraocular movements intact.  Musculoskeletal:     Comments: Tenderness to the anterior left shoulder joint without ecchymosis swelling or deformity, limited range of motion only able to laterally abduct about 25% before pain is elicited, 2+ brachial pulse, strength is a 4 out of 5, positive Hawkins  Neurological:     Mental Status: She is alert and oriented to person, place, and time. Mental status is at baseline.      UC Treatments / Results  Labs (all labs ordered are listed, but only abnormal results are displayed) Labs Reviewed - No data to display  EKG   Radiology No results found.  Procedures Procedures (including critical care time)  Medications Ordered in UC Medications - No data to display  Initial Impression / Assessment and Plan / UC Course  I have reviewed the triage vital signs and the nursing notes.  Pertinent labs & imaging results that were available during my care of the patient were reviewed by me and considered in my medical decision making (see chart for details).  Acute pain of left shoulder  Etiology most likely irritation to the joint, presentation consistent with a frozen shoulder, due to lack of injury will defer imaging and move forward with treatment of symptoms, declined IM injection, requested sling to be used as needed however upon trying on orthopedic device was not satisfied with fit and will  purchase over-the-counter, prescribed prednisone and Flexeril for outpatient use and recommended RICE heat massage stretching and activity as tolerated, given walking referral to orthopedics if symptoms persist or worsen Final Clinical Impressions(s) / UC Diagnoses   Final diagnoses:  Acute pain of left shoulder     Discharge Instructions      Your pain is most likely caused by irritation to the joint  Begin prednisone every morning with food for 5 days to help reduce inflammation which in turn will help with pain  You may use Tylenol 500 to 1000 mg every 6 hours in addition to this  You may  use muscle relaxant at bedtime as needed for additional comfort, be mindful this will make you feel sleepy  You may use heating pad in 15 minute intervals as needed for additional comfort or  you may find comfort in using ice in 10-15 minutes over affected area  Begin massaging or stretching affected area daily for 10 minutes as tolerated to further loosen muscles   When sitting and  lying down place pillow underneath arm and behind back for support  Practice good posture: head back, shoulders back, chest forward, pelvis back and weight distributed evenly on both legs  If pain persist after recommended treatment or reoccurs if may be beneficial to follow up with orthopedic specialist for evaluation, this doctor specializes in the bones and can manage your symptoms long-term with options such as but not limited to imaging, medications or physical therapy      ED Prescriptions     Medication Sig Dispense Auth. Provider   predniSONE (DELTASONE) 20 MG tablet Take 2 tablets (40 mg total) by mouth daily. 10 tablet Wade Sigala, Hansel Starling R, NP   cyclobenzaprine (FLEXERIL) 10 MG tablet Take 1 tablet (10 mg total) by mouth at bedtime. 10 tablet Valinda Hoar, NP      PDMP not reviewed this encounter.   Valinda Hoar, NP 06/04/23 1233

## 2023-06-04 NOTE — ED Triage Notes (Signed)
Patient presents to UC for shoulder pain x 4 days. States she woke up with pain. Treating pain with Tylenol.   Denies injury.

## 2023-06-04 NOTE — Discharge Instructions (Addendum)
Your pain is most likely caused by irritation to the joint  Begin prednisone every morning with food for 5 days to help reduce inflammation which in turn will help with pain  You may use Tylenol 500 to 1000 mg every 6 hours in addition to this  You may use muscle relaxant at bedtime as needed for additional comfort, be mindful this will make you feel sleepy  You may use heating pad in 15 minute intervals as needed for additional comfort or  you may find comfort in using ice in 10-15 minutes over affected area  Begin massaging or stretching affected area daily for 10 minutes as tolerated to further loosen muscles   When sitting and  lying down place pillow underneath arm and behind back for support  Practice good posture: head back, shoulders back, chest forward, pelvis back and weight distributed evenly on both legs  If pain persist after recommended treatment or reoccurs if may be beneficial to follow up with orthopedic specialist for evaluation, this doctor specializes in the bones and can manage your symptoms long-term with options such as but not limited to imaging, medications or physical therapy

## 2023-06-04 NOTE — ED Notes (Signed)
Patient states she will purchase her own sling at [facility].

## 2024-01-21 IMAGING — US US ABDOMEN LIMITED
1 series · 14 of 25 positions shown · non-contrast
Comparison: None.

CLINICAL DATA: Right upper quadrant pain

EXAM:
ULTRASOUND ABDOMEN LIMITED RIGHT UPPER QUADRANT

[Series 1: us abdomen limited · 14 of 80 slices shown]
[im 1/80]
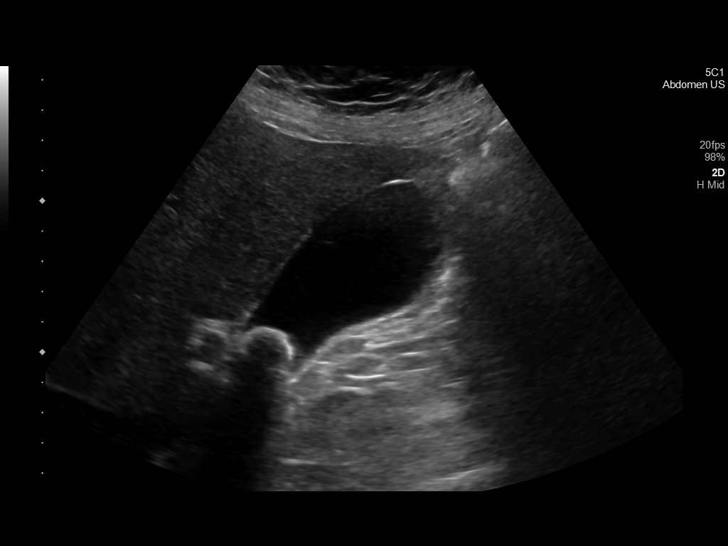
[im 7/80]
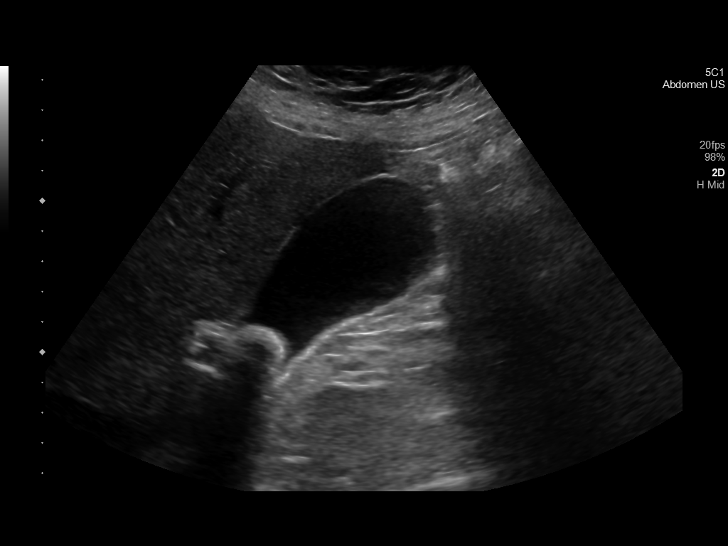
[im 14/80]
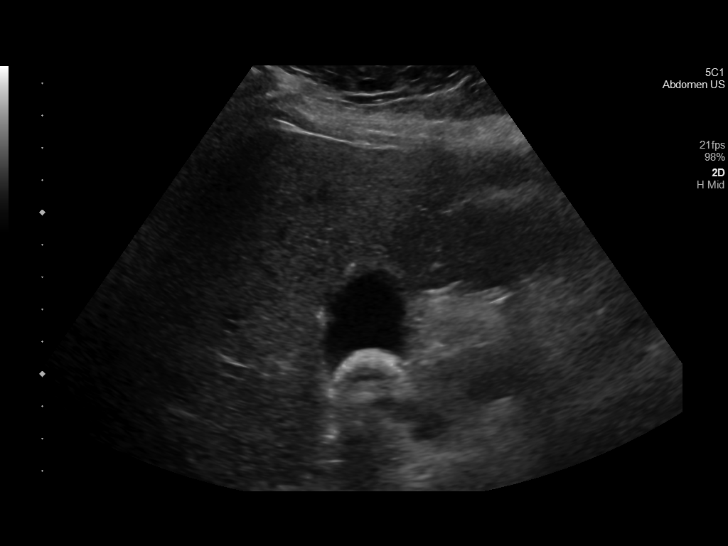
[im 20/80]
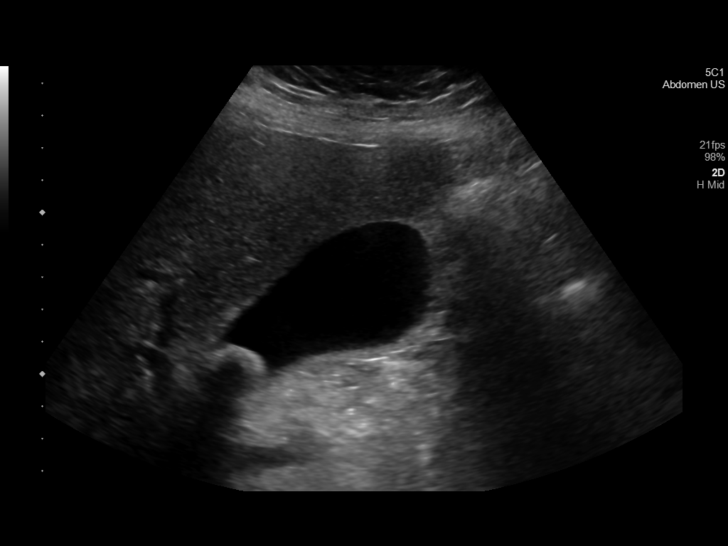
[im 27/80]
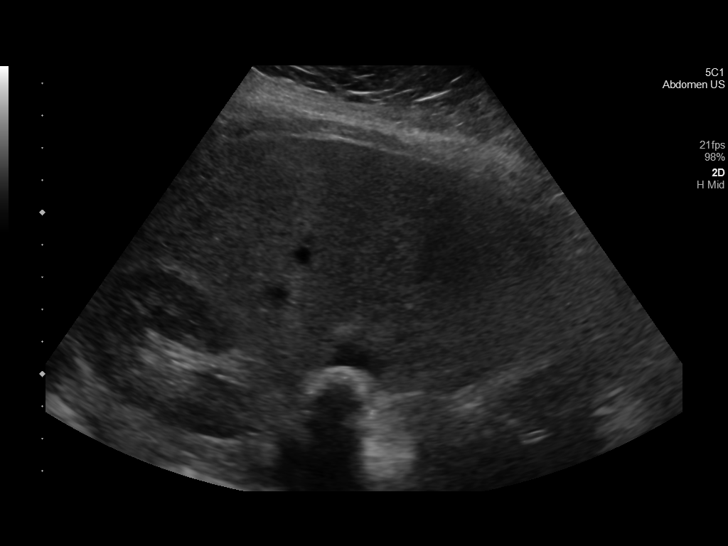
[im 30/80]
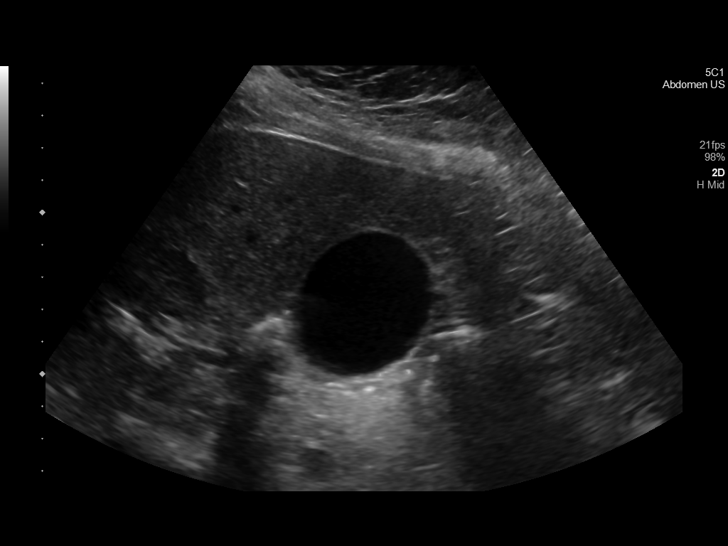
[im 37/80]
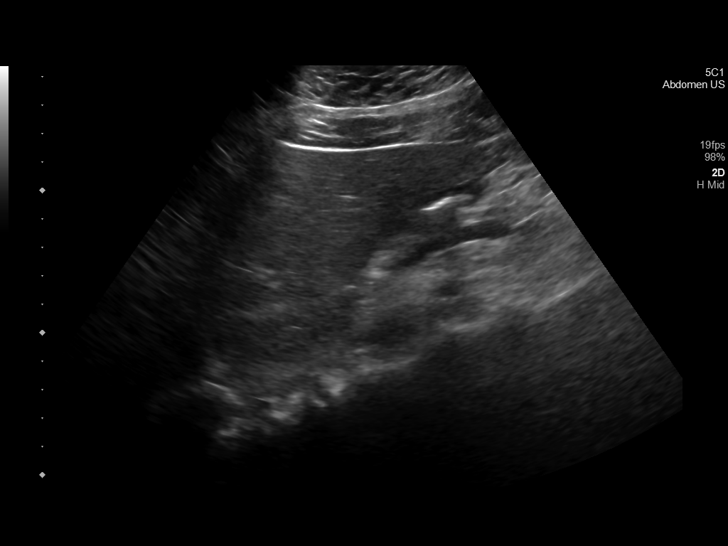
[im 43/80]
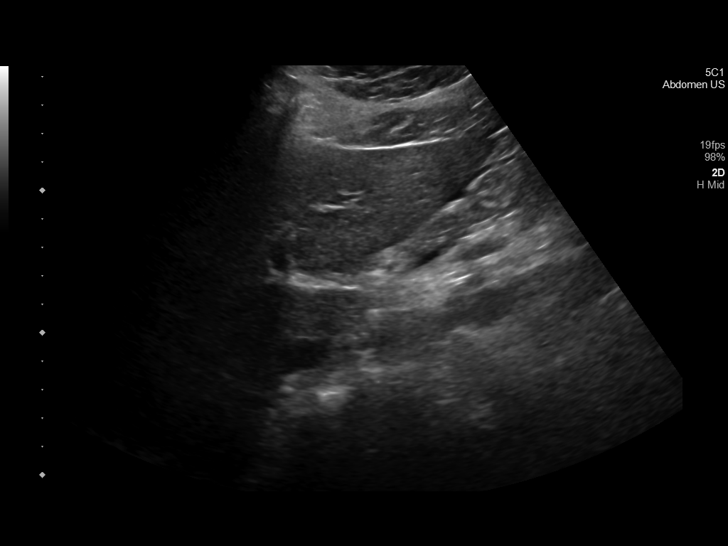
[im 50/80]
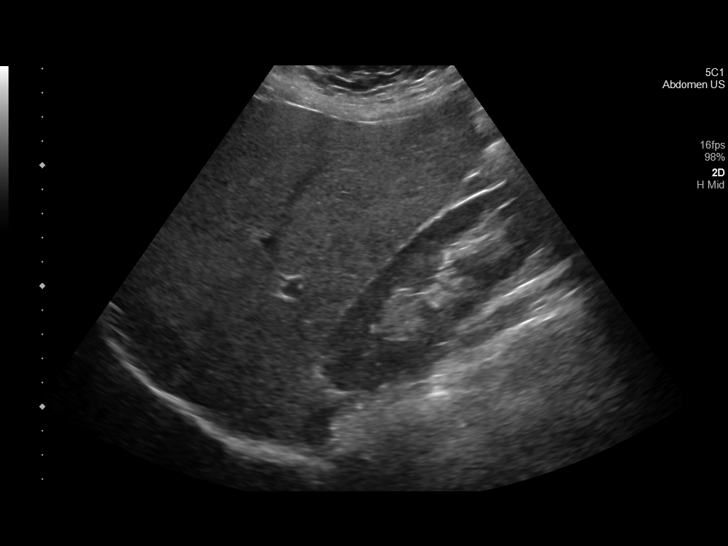
[im 53/80]
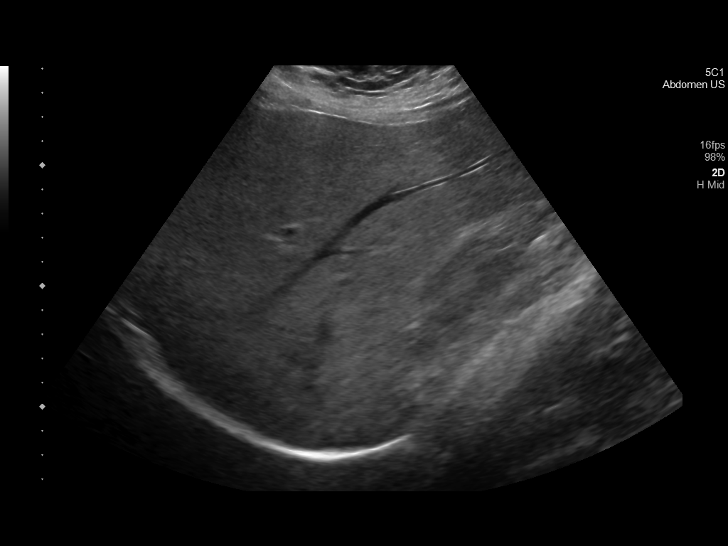
[im 60/80]
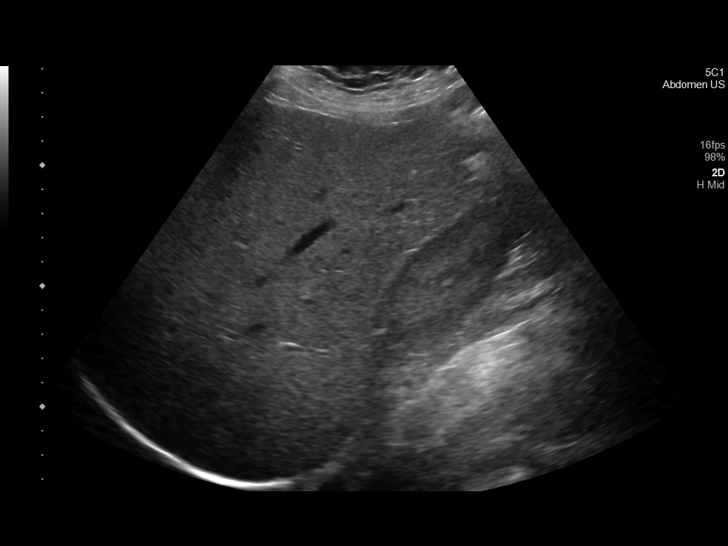
[im 66/80]
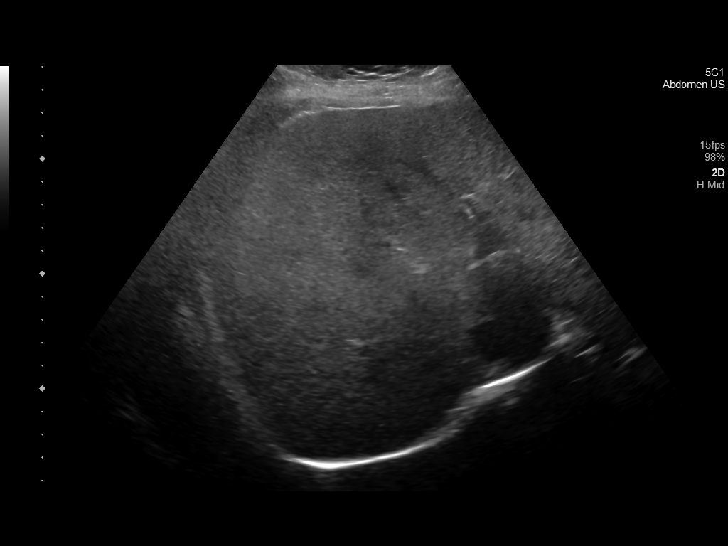
[im 73/80]
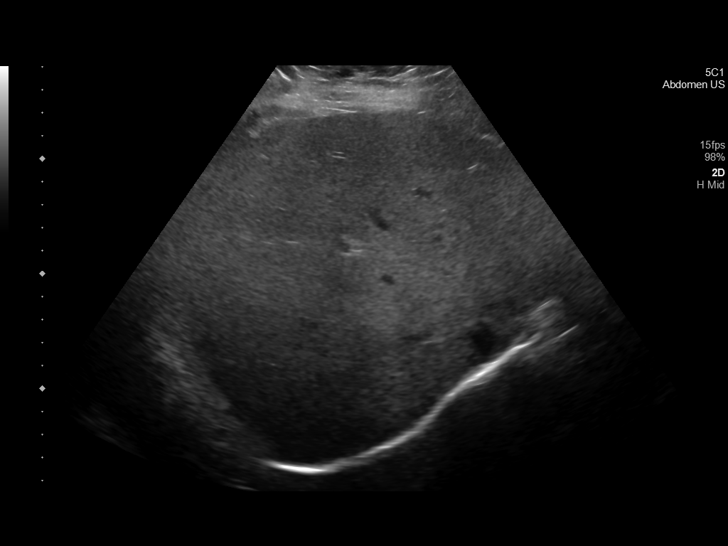
[im 80/80]
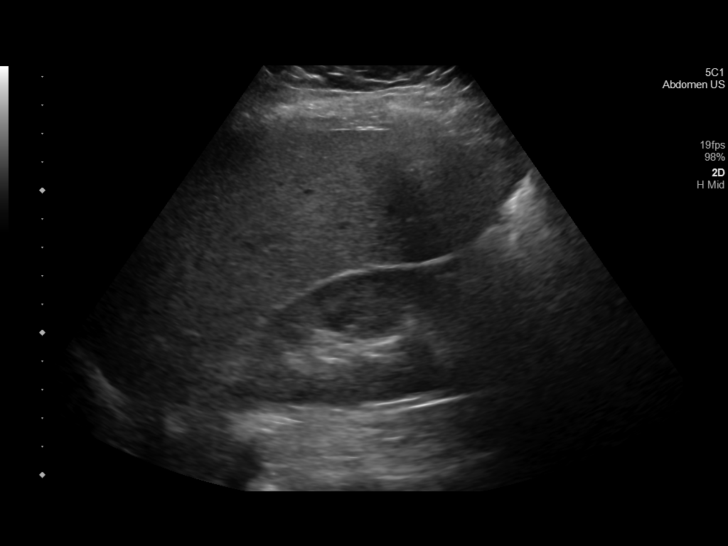

[14 of 25 positions shown; findings below may reference images not displayed]

FINDINGS: Gallbladder:

Stone in the neck of the gallbladder measures 1.9 cm. No wall
thickening or sonographic Murphy's sign.

Common bile duct:

Diameter: Normal caliber, 2 mm

Liver:

No focal lesion identified. Within normal limits in parenchymal
echogenicity. Portal vein is patent on color Doppler imaging with
normal direction of blood flow towards the liver.

Other: None.
IMPRESSION: 1.9 cm gallstone lodged in the neck of the gallbladder. No
sonographic evidence of acute cholecystitis.
